# Patient Record
Sex: Male | Born: 1955 | Race: White | Hispanic: No | Marital: Married | Smoking: Never smoker
Health system: Southern US, Community
[De-identification: ages and names within clinical notes are randomized; demographics above are authoritative.]

## PROBLEM LIST (undated history)

## (undated) DIAGNOSIS — M7552 Bursitis of left shoulder: Secondary | ICD-10-CM

## (undated) DIAGNOSIS — E785 Hyperlipidemia, unspecified: Secondary | ICD-10-CM

## (undated) HISTORY — PX: SHOULDER SURGERY: SHX246

## (undated) HISTORY — DX: Hyperlipidemia, unspecified: E78.5

## (undated) HISTORY — DX: Bursitis of left shoulder: M75.52

---

## 2004-04-19 ENCOUNTER — Ambulatory Visit (HOSPITAL_COMMUNITY): Admission: RE | Admit: 2004-04-19 | Discharge: 2004-04-19 | Payer: Self-pay | Admitting: Internal Medicine

## 2004-04-19 ENCOUNTER — Encounter (INDEPENDENT_AMBULATORY_CARE_PROVIDER_SITE_OTHER): Payer: Self-pay | Admitting: Specialist

## 2008-03-06 ENCOUNTER — Ambulatory Visit: Payer: Self-pay | Admitting: Internal Medicine

## 2009-03-22 ENCOUNTER — Telehealth: Payer: Self-pay | Admitting: Internal Medicine

## 2009-04-09 ENCOUNTER — Encounter: Payer: Self-pay | Admitting: Internal Medicine

## 2009-04-10 ENCOUNTER — Telehealth (INDEPENDENT_AMBULATORY_CARE_PROVIDER_SITE_OTHER): Payer: Self-pay | Admitting: *Deleted

## 2009-04-10 DIAGNOSIS — G4726 Circadian rhythm sleep disorder, shift work type: Secondary | ICD-10-CM

## 2009-04-12 ENCOUNTER — Encounter (INDEPENDENT_AMBULATORY_CARE_PROVIDER_SITE_OTHER): Payer: Self-pay | Admitting: *Deleted

## 2009-09-13 ENCOUNTER — Telehealth: Payer: Self-pay | Admitting: Internal Medicine

## 2010-08-18 LAB — CONVERTED CEMR LAB
Albumin: 4.3 g/dL (ref 3.5–5.2)
Alkaline Phosphatase: 31 units/L — ABNORMAL LOW (ref 39–117)
BUN: 20 mg/dL (ref 6–23)
Bilirubin Urine: NEGATIVE
Calcium: 9.2 mg/dL (ref 8.4–10.5)
Cholesterol: 221 mg/dL (ref 0–200)
Direct LDL: 150.6 mg/dL
Eosinophils Absolute: 0.1 10*3/uL (ref 0.0–0.7)
Eosinophils Relative: 2.1 % (ref 0.0–5.0)
GFR calc Af Amer: 82 mL/min
GFR calc non Af Amer: 68 mL/min
Glucose, Urine, Semiquant: NEGATIVE
HCT: 39.2 % (ref 39.0–52.0)
Hemoglobin: 13.6 g/dL (ref 13.0–17.0)
Ketones, urine, test strip: NEGATIVE
MCV: 92 fL (ref 78.0–100.0)
Monocytes Absolute: 0.6 10*3/uL (ref 0.1–1.0)
Neutro Abs: 2.9 10*3/uL (ref 1.4–7.7)
PSA: 2.1 ng/mL (ref 0.10–4.00)
Platelets: 243 10*3/uL (ref 150–400)
Potassium: 4.2 meq/L (ref 3.5–5.1)
RDW: 12.3 % (ref 11.5–14.6)
Specific Gravity, Urine: 1.01
TSH: 2.27 microintl units/mL (ref 0.35–5.50)
Triglycerides: 72 mg/dL (ref 0–149)
WBC: 5.3 10*3/uL (ref 4.5–10.5)

## 2010-08-21 ENCOUNTER — Ambulatory Visit
Admission: RE | Admit: 2010-08-21 | Discharge: 2010-08-21 | Disposition: A | Discharge status: Home / Self Care | Payer: PRIVATE HEALTH INSURANCE | Source: Ambulatory Visit | Attending: Neurosurgery | Admitting: Neurosurgery

## 2010-08-21 ENCOUNTER — Other Ambulatory Visit: Payer: Self-pay | Admitting: Neurosurgery

## 2010-08-21 DIAGNOSIS — M545 Low back pain: Secondary | ICD-10-CM

## 2010-08-21 NOTE — Progress Notes (Signed)
Summary: Schedule Colonoscopy  Phone Note Outgoing Call   Call placed by: Hortense Ramal CMA Duncan Dull),  September 13, 2009 12:00 PM Call placed to: Patient Summary of Call: Patient is due for a colonoscopy. I have left a message with a male for patient to call back. Initial call taken by: Hortense Ramal CMA Duncan Dull),  September 13, 2009 12:02 PM  Follow-up for Phone Call        Spoke with Patient. He states he will call back and schedule colonoscopy once he looks back at his calender and finds a good date to come. Follow-up by: Hortense Ramal CMA Duncan Dull),  September 17, 2009 4:27 PM

## 2010-08-22 ENCOUNTER — Other Ambulatory Visit: Payer: Self-pay | Admitting: Neurosurgery

## 2010-08-22 ENCOUNTER — Ambulatory Visit
Admission: RE | Admit: 2010-08-22 | Discharge: 2010-08-22 | Disposition: A | Discharge status: Home / Self Care | Payer: PRIVATE HEALTH INSURANCE | Source: Ambulatory Visit | Attending: Neurosurgery | Admitting: Neurosurgery

## 2010-08-22 DIAGNOSIS — M549 Dorsalgia, unspecified: Secondary | ICD-10-CM

## 2011-12-16 ENCOUNTER — Telehealth: Payer: Self-pay | Admitting: Internal Medicine

## 2011-12-16 NOTE — Telephone Encounter (Signed)
Pt has not been seen since 2009.  We can't call in any meds until he is seen

## 2011-12-16 NOTE — Telephone Encounter (Signed)
Patient called stating that he will be working overnight next week and would like to know if you can call him in 4 provigil into his pharmacy [facility] [address]. Please advise.

## 2012-04-28 ENCOUNTER — Telehealth: Payer: Self-pay | Admitting: Internal Medicine

## 2012-04-28 NOTE — Telephone Encounter (Signed)
Pt would like to re-est with Dr Timoteo Gaul last appt 2009.

## 2012-04-29 NOTE — Telephone Encounter (Signed)
Yes. Schedule appt with dr. Pecolia Ades

## 2012-04-30 NOTE — Telephone Encounter (Signed)
Pt has been sch

## 2012-06-04 ENCOUNTER — Encounter: Payer: Self-pay | Admitting: Internal Medicine

## 2012-06-04 ENCOUNTER — Ambulatory Visit (INDEPENDENT_AMBULATORY_CARE_PROVIDER_SITE_OTHER): Payer: PRIVATE HEALTH INSURANCE | Admitting: Internal Medicine

## 2012-06-04 VITALS — BP 114/78 | HR 60 | Temp 97.5°F | Ht 73.0 in | Wt 172.0 lb

## 2012-06-04 DIAGNOSIS — Z0000 Encounter for general adult medical examination without abnormal findings: Secondary | ICD-10-CM

## 2012-06-06 NOTE — Progress Notes (Signed)
Patient ID: Collin Ford, male   DOB: Oct 23, 1955, 56 y.o.   MRN: [id number] cpx  History reviewed. No pertinent past medical history.  History   Social History  . Marital Status: Married    Spouse Name: N/A    Number of Children: N/A  . Years of Education: N/A   Occupational History  . Not on file.   Social History Main Topics  . Smoking status: Never Smoker   . Smokeless tobacco: Not on file  . Alcohol Use: 3.0 oz/week    5 Glasses of wine per week  . Drug Use: No  . Sexually Active: Not on file   Other Topics Concern  . Not on file   Social History Narrative  . No narrative on file    Past Surgical History  Procedure Date  . Shoulder surgery 1990 & 1997    x2, right    Family History  Problem Relation Age of Onset  . Diabetes    . Diabetes Father   . Parkinsonism Father     Allergies  Allergen Reactions  . Sulfamethoxazole     REACTION: hives    No current outpatient prescriptions on file prior to visit.     patient denies chest pain, shortness of breath, orthopnea. Denies lower extremity edema, abdominal pain, change in appetite, change in bowel movements. Patient denies rashes, musculoskeletal complaints. No other specific complaints in a complete review of systems.   BP 114/78  Pulse 60  Temp 97.5 F (36.4 C) (Oral)  Ht 6\' 1"  (1.854 m)  Wt 172 lb (78.019 kg)  BMI 22.69 kg/m2  well-developed well-nourished male in no acute distress. HEENT exam atraumatic, normocephalic, neck supple without jugular venous distention. Chest clear to auscultation cardiac exam S1-S2 are regular. Abdominal exam overweight with bowel sounds, soft and nontender. Extremities no edema. Neurologic exam is alert with a normal gait.  A/p- well visit- Health Maint UTD

## 2012-10-05 ENCOUNTER — Encounter: Payer: Self-pay | Admitting: Internal Medicine

## 2012-10-26 ENCOUNTER — Other Ambulatory Visit: Payer: Self-pay | Admitting: Orthopedic Surgery

## 2012-10-26 DIAGNOSIS — M25511 Pain in right shoulder: Secondary | ICD-10-CM

## 2012-10-27 ENCOUNTER — Ambulatory Visit
Admission: RE | Admit: 2012-10-27 | Discharge: 2012-10-27 | Disposition: A | Discharge status: Home / Self Care | Payer: PRIVATE HEALTH INSURANCE | Source: Ambulatory Visit | Attending: Orthopedic Surgery | Admitting: Orthopedic Surgery

## 2012-10-27 DIAGNOSIS — M25511 Pain in right shoulder: Secondary | ICD-10-CM

## 2012-10-27 MED ORDER — IOHEXOL 180 MG/ML  SOLN: 13.0000 mL | Freq: Once | INTRAMUSCULAR | Status: AC | PRN

## 2012-11-02 ENCOUNTER — Other Ambulatory Visit: Payer: PRIVATE HEALTH INSURANCE

## 2012-12-28 ENCOUNTER — Encounter: Payer: PRIVATE HEALTH INSURANCE | Admitting: Internal Medicine

## 2013-03-15 ENCOUNTER — Ambulatory Visit
Admission: RE | Admit: 2013-03-15 | Discharge: 2013-03-15 | Disposition: A | Discharge status: Home / Self Care | Payer: PRIVATE HEALTH INSURANCE | Source: Ambulatory Visit | Attending: Neurosurgery | Admitting: Neurosurgery

## 2013-03-15 ENCOUNTER — Other Ambulatory Visit: Payer: Self-pay | Admitting: Neurosurgery

## 2013-03-15 DIAGNOSIS — M545 Low back pain: Secondary | ICD-10-CM

## 2013-03-16 ENCOUNTER — Other Ambulatory Visit: Payer: Self-pay | Admitting: Neurosurgery

## 2013-03-16 DIAGNOSIS — M549 Dorsalgia, unspecified: Secondary | ICD-10-CM

## 2013-03-17 ENCOUNTER — Ambulatory Visit
Admission: RE | Admit: 2013-03-17 | Discharge: 2013-03-17 | Disposition: A | Discharge status: Home / Self Care | Payer: PRIVATE HEALTH INSURANCE | Source: Ambulatory Visit | Attending: Neurosurgery | Admitting: Neurosurgery

## 2013-03-17 ENCOUNTER — Other Ambulatory Visit: Payer: Self-pay | Admitting: Neurosurgery

## 2013-03-17 DIAGNOSIS — M549 Dorsalgia, unspecified: Secondary | ICD-10-CM

## 2013-03-17 MED ORDER — IOHEXOL 180 MG/ML  SOLN
1.0000 mL | Freq: Once | INTRAMUSCULAR | Status: AC | PRN
  Administered 2013-03-17: 1 mL via EPIDURAL

## 2013-03-17 MED ORDER — METHYLPREDNISOLONE ACETATE 40 MG/ML INJ SUSP (RADIOLOG: 120.0000 mg | Freq: Once | INTRAMUSCULAR | Status: DC

## 2013-04-28 ENCOUNTER — Encounter: Payer: PRIVATE HEALTH INSURANCE | Admitting: Internal Medicine

## 2013-06-22 ENCOUNTER — Encounter: Payer: Self-pay | Admitting: Internal Medicine

## 2018-03-10 ENCOUNTER — Other Ambulatory Visit: Payer: Self-pay | Admitting: Otolaryngology

## 2018-03-10 DIAGNOSIS — R05 Cough: Secondary | ICD-10-CM

## 2018-03-10 DIAGNOSIS — R053 Chronic cough: Secondary | ICD-10-CM

## 2018-03-11 ENCOUNTER — Ambulatory Visit
Admission: RE | Admit: 2018-03-11 | Discharge: 2018-03-11 | Disposition: A | Discharge status: Home / Self Care | Payer: PRIVATE HEALTH INSURANCE | Source: Ambulatory Visit | Attending: Otolaryngology | Admitting: Otolaryngology

## 2018-03-11 DIAGNOSIS — R05 Cough: Secondary | ICD-10-CM

## 2018-03-11 DIAGNOSIS — R053 Chronic cough: Secondary | ICD-10-CM

## 2018-03-15 ENCOUNTER — Other Ambulatory Visit: Payer: Self-pay | Admitting: Otolaryngology

## 2018-03-15 DIAGNOSIS — R059 Cough, unspecified: Secondary | ICD-10-CM

## 2018-03-15 DIAGNOSIS — R05 Cough: Secondary | ICD-10-CM

## 2018-03-16 ENCOUNTER — Ambulatory Visit
Admission: RE | Admit: 2018-03-16 | Discharge: 2018-03-16 | Disposition: A | Discharge status: Home / Self Care | Payer: PRIVATE HEALTH INSURANCE | Source: Ambulatory Visit | Attending: Otolaryngology | Admitting: Otolaryngology

## 2018-03-16 DIAGNOSIS — R059 Cough, unspecified: Secondary | ICD-10-CM

## 2018-03-16 DIAGNOSIS — R05 Cough: Secondary | ICD-10-CM

## 2018-04-16 ENCOUNTER — Other Ambulatory Visit: Payer: Self-pay | Admitting: Neurosurgery

## 2018-04-16 DIAGNOSIS — M47816 Spondylosis without myelopathy or radiculopathy, lumbar region: Secondary | ICD-10-CM

## 2018-04-22 ENCOUNTER — Ambulatory Visit
Admission: RE | Admit: 2018-04-22 | Discharge: 2018-04-22 | Disposition: A | Discharge status: Home / Self Care | Payer: PRIVATE HEALTH INSURANCE | Source: Ambulatory Visit | Attending: Neurosurgery | Admitting: Neurosurgery

## 2018-04-22 DIAGNOSIS — M47816 Spondylosis without myelopathy or radiculopathy, lumbar region: Secondary | ICD-10-CM

## 2018-06-08 ENCOUNTER — Ambulatory Visit
Admission: RE | Admit: 2018-06-08 | Discharge: 2018-06-08 | Disposition: A | Discharge status: Home / Self Care | Payer: PRIVATE HEALTH INSURANCE | Source: Ambulatory Visit | Attending: Orthopedic Surgery | Admitting: Orthopedic Surgery

## 2018-06-08 ENCOUNTER — Other Ambulatory Visit: Payer: Self-pay | Admitting: Orthopedic Surgery

## 2018-06-08 DIAGNOSIS — M25511 Pain in right shoulder: Secondary | ICD-10-CM

## 2020-05-28 DIAGNOSIS — L821 Other seborrheic keratosis: Secondary | ICD-10-CM | POA: Diagnosis not present

## 2020-05-28 DIAGNOSIS — L649 Androgenic alopecia, unspecified: Secondary | ICD-10-CM | POA: Diagnosis not present

## 2020-05-28 DIAGNOSIS — L570 Actinic keratosis: Secondary | ICD-10-CM | POA: Diagnosis not present

## 2020-08-02 DIAGNOSIS — D165 Benign neoplasm of lower jaw bone: Secondary | ICD-10-CM | POA: Diagnosis not present

## 2020-10-13 ENCOUNTER — Encounter (HOSPITAL_BASED_OUTPATIENT_CLINIC_OR_DEPARTMENT_OTHER): Payer: Self-pay

## 2020-10-13 ENCOUNTER — Emergency Department (HOSPITAL_BASED_OUTPATIENT_CLINIC_OR_DEPARTMENT_OTHER)
Admission: EM | Admit: 2020-10-13 | Discharge: 2020-10-13 | Disposition: A | Discharge status: Home / Self Care | Payer: BC Managed Care – PPO | Attending: Emergency Medicine | Admitting: Emergency Medicine

## 2020-10-13 ENCOUNTER — Other Ambulatory Visit: Payer: Self-pay

## 2020-10-13 DIAGNOSIS — W2209XA Striking against other stationary object, initial encounter: Secondary | ICD-10-CM | POA: Insufficient documentation

## 2020-10-13 DIAGNOSIS — S0591XA Unspecified injury of right eye and orbit, initial encounter: Secondary | ICD-10-CM | POA: Diagnosis not present

## 2020-10-13 DIAGNOSIS — Y92009 Unspecified place in unspecified non-institutional (private) residence as the place of occurrence of the external cause: Secondary | ICD-10-CM | POA: Insufficient documentation

## 2020-10-13 DIAGNOSIS — S0501XA Injury of conjunctiva and corneal abrasion without foreign body, right eye, initial encounter: Secondary | ICD-10-CM | POA: Insufficient documentation

## 2020-10-13 DIAGNOSIS — Y93E5 Activity, floor mopping and cleaning: Secondary | ICD-10-CM | POA: Insufficient documentation

## 2020-10-13 MED ORDER — FLUORESCEIN SODIUM 1 MG OP STRP
1.0000 | ORAL_STRIP | Freq: Once | OPHTHALMIC | Status: AC
  Administered 2020-10-13: 1 via OPHTHALMIC
  Filled 2020-10-13: qty 1

## 2020-10-13 MED ORDER — ERYTHROMYCIN 5 MG/GM OP OINT
TOPICAL_OINTMENT | Freq: Once | OPHTHALMIC | Status: AC
  Filled 2020-10-13: qty 3.5

## 2020-10-13 MED ORDER — TETRACAINE HCL 0.5 % OP SOLN
2.0000 [drp] | Freq: Once | OPHTHALMIC | Status: AC
  Administered 2020-10-13: 2 [drp] via OPHTHALMIC
  Filled 2020-10-13: qty 4

## 2020-10-13 MED ORDER — ERYTHROMYCIN 5 MG/GM OP OINT: TOPICAL_OINTMENT | OPHTHALMIC | 0 refills | Status: AC

## 2020-10-13 NOTE — ED Notes (Signed)
ED Provider at bedside. 

## 2020-10-13 NOTE — Discharge Instructions (Signed)
Apply the erythromycin ointment 4 times per day for 5 days to your right eye as shown (by smearing a small amount along the lower aspect of your lower eyelid, with clean hands).

## 2020-10-13 NOTE — ED Triage Notes (Signed)
Pt came in c/o right eye injury -  States he poke his right eye with  Little card board  30  mins ago  - denies visual  Changes

## 2020-10-13 NOTE — ED Provider Notes (Signed)
[facility] EMERGENCY DEPT Provider Note   CSN: [id number] Arrival date & time: 10/13/20  2019     History Chief Complaint  Patient presents with  . Eye Injury    Collin Ford is a 65 y.o. male presenting to ED with right eye pain.  He accidentally scraped his right eye against the edge of a cardboard box while cleaning at his house tonight.  Initially had pain and blurred vision, but vision is now intact and pain has become minimal.  Does not wear contacts or prescription lenses.  Patient is a radiologist.  HPI     History reviewed. No pertinent past medical history.  Patient Active Problem List   Diagnosis Date Noted  . 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 SHOULDER SURGERY  1990 & 1997   x2, right       Family History  Problem Relation Age of Onset  . Diabetes Other   . Diabetes Father   . Parkinsonism Father     Social History   Tobacco Use  . Smoking status: Never Smoker  Vaping Use  . Vaping Use: Never used  Substance Use Topics  . Alcohol use: Yes    Alcohol/week: 5.0 standard drinks    Types: 5 Glasses of wine per week  . Drug use: No    Home Medications Prior to Admission medications   Medication Sig Start Date End Date Taking? Authorizing Provider  erythromycin ophthalmic ointment Place a 1/2 inch ribbon of ointment into the lower eyelid of right eye, four times daily, for 5 days 10/13/20 10/17/20 Yes Keller Mikels, Kermit Balo, MD    Allergies    Sulfamethoxazole  Review of Systems   Review of Systems  Constitutional: Negative for chills and fever.  HENT: Positive for ear pain. Negative for sore throat.   Eyes: Positive for photophobia, pain, redness and visual disturbance. Negative for itching.  All other systems reviewed and are negative.   Physical Exam Updated Vital Signs BP 125/73 (BP Location: Right Arm)   Pulse 72   Temp 98.1 F (36.7 C)  (Oral)   Resp 18   Ht 6' (1.829 m)   Wt 74.8 kg   SpO2 100%   BMI 22.38 kg/m   Physical Exam Constitutional:      General: He is not in acute distress. HENT:     Head: Normocephalic and atraumatic.  Eyes:     General: Lids are normal. Lids are everted, no foreign bodies appreciated. Vision grossly intact. Gaze aligned appropriately.     Extraocular Movements:     Right eye: Normal extraocular motion.     Left eye: Normal extraocular motion.     Conjunctiva/sclera: Conjunctivae normal.     Right eye: Right conjunctiva is not injected. No chemosis, exudate or hemorrhage.    Left eye: Left conjunctiva is not injected. No chemosis, exudate or hemorrhage.    Pupils: Pupils are equal, round, and reactive to light.     Comments: Fluorescein stain with small punctate lesion and uptake on central lower cornea  Cardiovascular:     Rate and Rhythm: Normal rate and regular rhythm.  Skin:    General: Skin is warm and dry.  Neurological:     General: No focal deficit present.     Mental Status: He is alert. Mental status is at baseline.  Psychiatric:        Mood and Affect: Mood normal.  Behavior: Behavior normal.     ED Results / Procedures / Treatments   Labs (all labs ordered are listed, but only abnormal results are displayed) Labs Reviewed - No data to display  EKG None  Radiology No results found.  Procedures Procedures   Medications Ordered in ED Medications  fluorescein ophthalmic strip 1 strip (1 strip Both Eyes Given 10/13/20 2037)  tetracaine (PONTOCAINE) 0.5 % ophthalmic solution 2 drop (2 drops Both Eyes Given 10/13/20 2036)  erythromycin ophthalmic ointment ( Right Eye Given 10/13/20 2105)    ED Course  I have reviewed the triage vital signs and the nursing notes.  Pertinent labs & imaging results that were available during my care of the patient were reviewed by me and considered in my medical decision making (see chart for details).  Very small  corneal abrasion to right eye on staining No evidence of globe rupture Vision is grossly intact  Will start on erythromycin ointment x 5 days    Final Clinical Impression(s) / ED Diagnoses Final diagnoses:  Abrasion of right cornea, initial encounter    Rx / DC Orders ED Discharge Orders         Ordered    erythromycin ophthalmic ointment        10/13/20 2100           Terald Sleeper, MD 10/13/20 2118

## 2020-10-18 ENCOUNTER — Other Ambulatory Visit (HOSPITAL_COMMUNITY): Payer: Self-pay | Admitting: Emergency Medicine

## 2020-10-19 ENCOUNTER — Other Ambulatory Visit (HOSPITAL_COMMUNITY): Payer: Self-pay | Admitting: *Deleted

## 2020-10-31 ENCOUNTER — Ambulatory Visit (HOSPITAL_BASED_OUTPATIENT_CLINIC_OR_DEPARTMENT_OTHER)
Admission: RE | Admit: 2020-10-31 | Discharge: 2020-10-31 | Disposition: A | Discharge status: Home / Self Care | Payer: BC Managed Care – PPO | Source: Ambulatory Visit | Attending: Cardiology | Admitting: Cardiology

## 2020-10-31 ENCOUNTER — Other Ambulatory Visit: Payer: Self-pay

## 2020-12-20 ENCOUNTER — Ambulatory Visit: Payer: BC Managed Care – PPO | Attending: Internal Medicine

## 2020-12-20 ENCOUNTER — Other Ambulatory Visit: Payer: Self-pay

## 2020-12-20 ENCOUNTER — Other Ambulatory Visit (HOSPITAL_BASED_OUTPATIENT_CLINIC_OR_DEPARTMENT_OTHER): Payer: Self-pay

## 2020-12-20 DIAGNOSIS — Z23 Encounter for immunization: Secondary | ICD-10-CM

## 2020-12-20 MED ORDER — PFIZER-BIONT COVID-19 VAC-TRIS 30 MCG/0.3ML IM SUSP
INTRAMUSCULAR | 0 refills | Status: DC
  Filled 2020-12-20: qty 0.3, 1d supply, fill #0

## 2020-12-20 NOTE — Progress Notes (Signed)
   Covid-19 Vaccination Clinic  Name:  Raihan Kimmel    MRN: [id number] DOB: 1956-04-24  12/20/2020  Mr. Rittenhouse was observed post Covid-19 immunization for 15 minutes without incident. He was provided with Vaccine Information Sheet and instruction to access the V-Safe system.   Mr. Ku was instructed to call 911 with any severe reactions post vaccine: Marland Kitchen Difficulty breathing  . Swelling of face and throat  . A fast heartbeat  . A bad rash all over body  . Dizziness and weakness   Immunizations Administered    Name Date Dose VIS Date Route   PFIZER Comrnaty(Gray TOP) Covid-19 Vaccine 12/20/2020 11:37 AM 0.3 mL 06/28/2020 Intramuscular   Manufacturer: [employer], [employer]   Lot: [id number]   NDC: [phone]

## 2021-02-18 DIAGNOSIS — Z0000 Encounter for general adult medical examination without abnormal findings: Secondary | ICD-10-CM | POA: Diagnosis not present

## 2021-02-18 DIAGNOSIS — Z1339 Encounter for screening examination for other mental health and behavioral disorders: Secondary | ICD-10-CM | POA: Diagnosis not present

## 2021-02-18 DIAGNOSIS — Z1331 Encounter for screening for depression: Secondary | ICD-10-CM | POA: Diagnosis not present

## 2021-02-18 DIAGNOSIS — M25512 Pain in left shoulder: Secondary | ICD-10-CM | POA: Diagnosis not present

## 2021-02-18 DIAGNOSIS — R7301 Impaired fasting glucose: Secondary | ICD-10-CM | POA: Diagnosis not present

## 2021-05-13 ENCOUNTER — Other Ambulatory Visit (HOSPITAL_BASED_OUTPATIENT_CLINIC_OR_DEPARTMENT_OTHER): Payer: Self-pay

## 2021-05-13 ENCOUNTER — Other Ambulatory Visit: Payer: Self-pay

## 2021-05-13 ENCOUNTER — Ambulatory Visit: Payer: BC Managed Care – PPO | Attending: Internal Medicine

## 2021-05-13 DIAGNOSIS — Z23 Encounter for immunization: Secondary | ICD-10-CM

## 2021-05-13 MED ORDER — PFIZER COVID-19 VAC BIVALENT 30 MCG/0.3ML IM SUSP
INTRAMUSCULAR | 0 refills | Status: DC
  Filled 2021-05-13: qty 0.3, 1d supply, fill #0

## 2021-05-13 MED ORDER — FLUARIX QUADRIVALENT 0.5 ML IM SUSY
PREFILLED_SYRINGE | INTRAMUSCULAR | 0 refills | Status: DC
  Filled 2021-05-13: qty 0.5, 1d supply, fill #0

## 2021-05-13 NOTE — Progress Notes (Signed)
   Covid-19 Vaccination Clinic  Name:  Collin Ford    MRN: [id number] DOB: 12/30/1955  05/13/2021  [name] was observed post Covid-19 immunization for 15 minutes without incident. He was provided with Vaccine Information Sheet and instruction to access the V-Safe system.   [name] was instructed to call 911 with any severe reactions post vaccine: Difficulty breathing  Swelling of face and throat  A fast heartbeat  A bad rash all over body  Dizziness and weakness   Immunizations Administered     Name Date Dose VIS Date Route   Pfizer Covid-19 Vaccine Bivalent Booster 05/13/2021  2:06 PM 0.3 mL 03/20/2021 Intramuscular   Manufacturer: [employer], [employer]   Lot: [id number]   NDC: [phone]

## 2021-09-04 IMAGING — CT CT CARDIAC CORONARY ARTERY CALCIUM SCORE
3 series · 14 of 20 positions shown, 16 images · non-contrast
Comparison: None.
COMPARISON: None.

Addendum:
EXAM:
OVER-READ INTERPRETATION  CT CHEST

The following report is an over-read performed by radiologist Dr.
Yomara Payamps [REDACTED] on 10/31/2020. This over-read
does not include interpretation of cardiac or coronary anatomy or
pathology. The coronary calcium interpretation by the cardiologist
is attached.
CLINICAL DATA: Cardiovascular Disease Risk stratification
Coronary Calcium Score
TECHNIQUE: A gated, non-contrast computed tomography scan of the heart was
performed using 3mm slice thickness. Axial images were analyzed on a
dedicated workstation. Calcium scoring of the coronary arteries was
performed using the Agatston method.

[Series 2: casc 3.0 best diast 70 % (id) · axial · 0.39mm/px · z∈[+1233,+1332]mm · 4 of 55 slices shown]
[im 11/55  vessel]
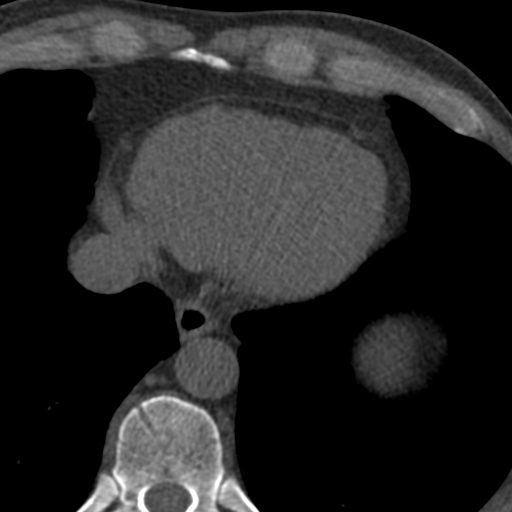
[im 22/55  vessel]
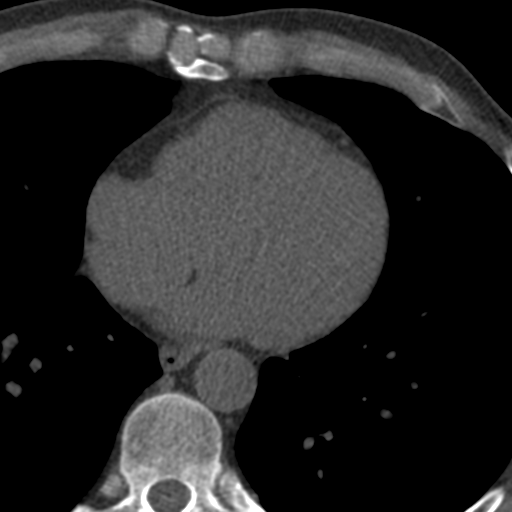
[im 33/55  vessel]
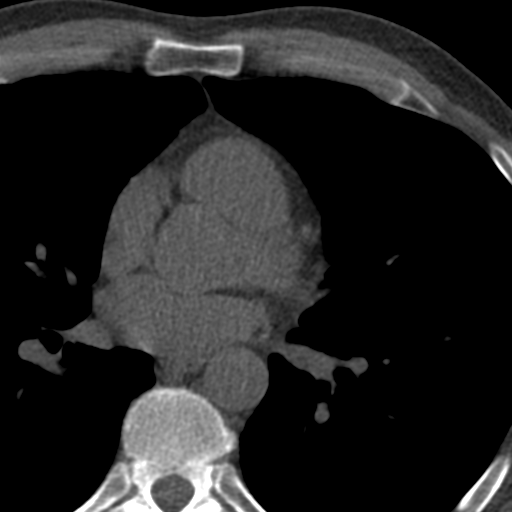
[im 44/55  vessel]
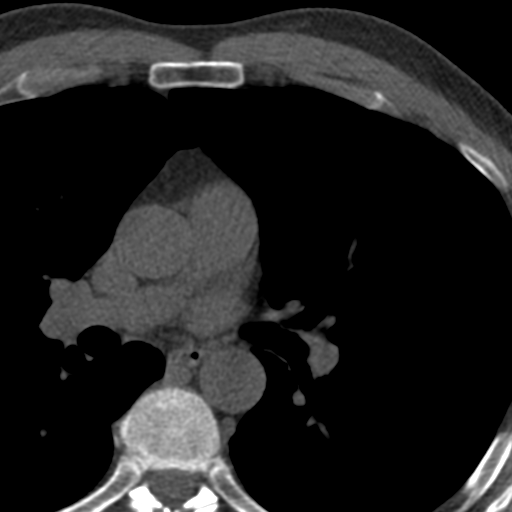

[Series 3: soft full fov 69 % · axial · 0.64mm/px · z∈[+1230,+1338]mm · 5 of 55 slices shown, 7 images]
[im 10/55  vessel]
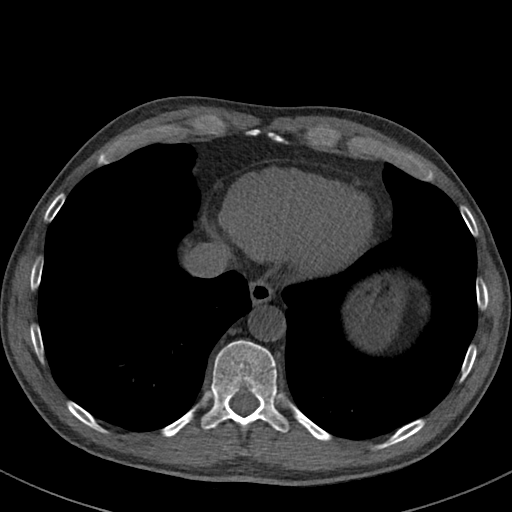
[im 10/55  lung]
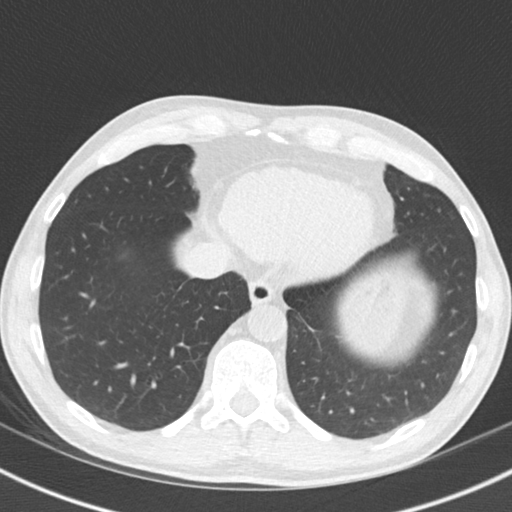
[im 19/55  vessel]
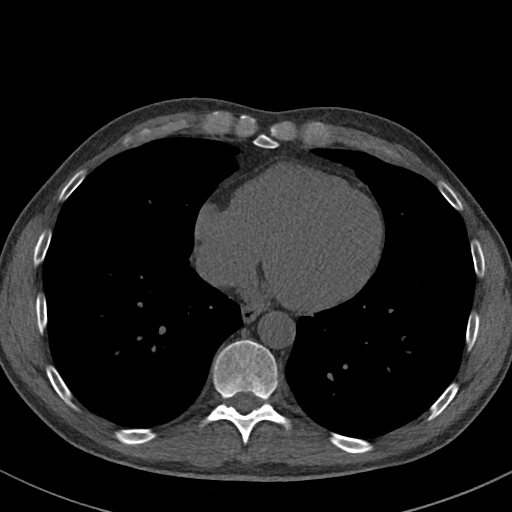
[im 28/55  vessel]
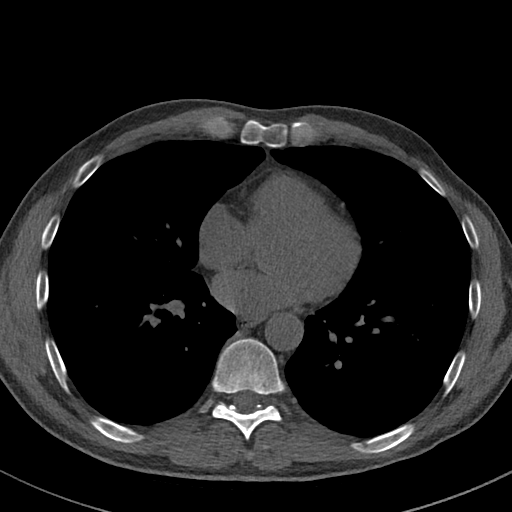
[im 37/55  vessel]
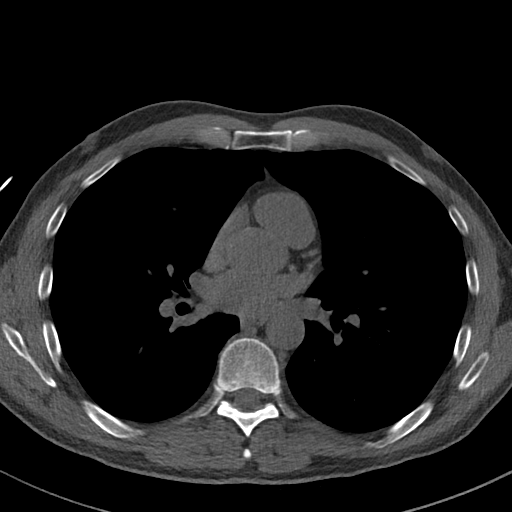
[im 46/55  vessel]
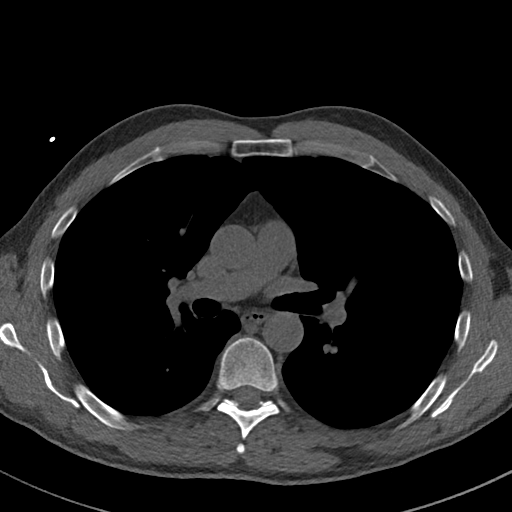
[im 46/55  lung]
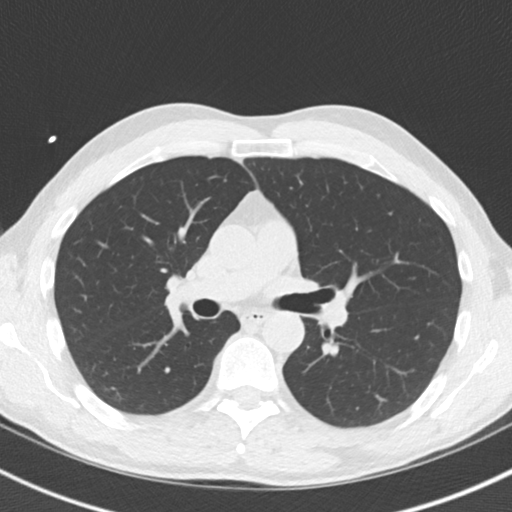

[Series 4: lungs 69 % · axial · 0.64mm/px · z∈[+1230,+1338]mm · 5 of 55 slices shown]
[im 10/55  vessel]
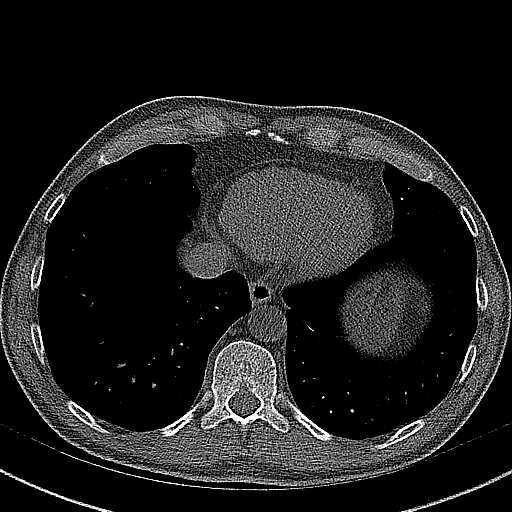
[im 19/55  vessel]
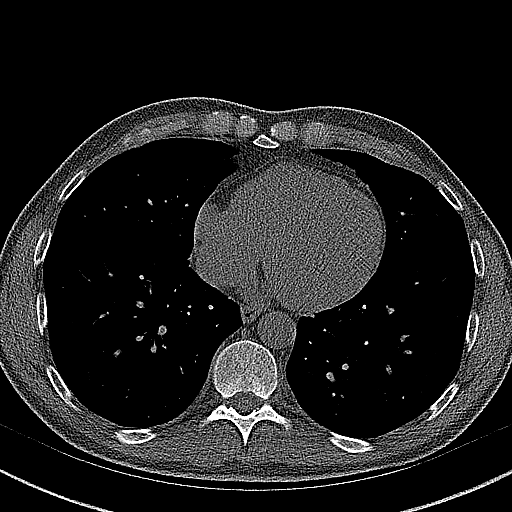
[im 28/55  vessel]
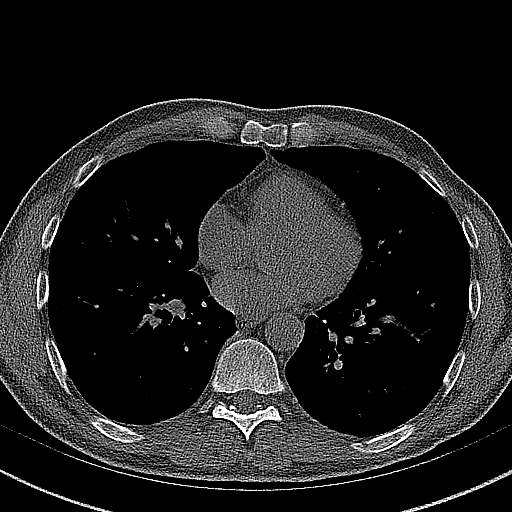
[im 37/55  vessel]
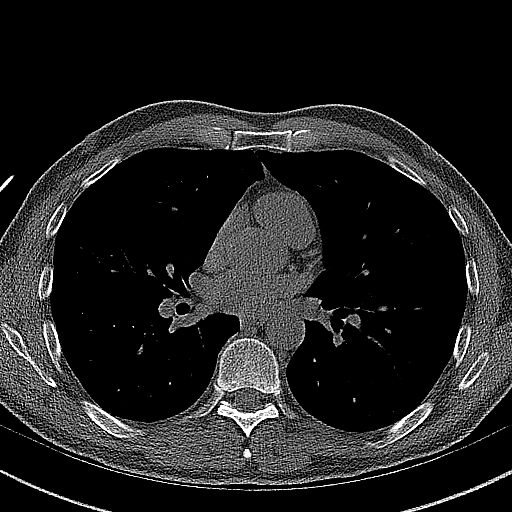
[im 46/55  vessel]
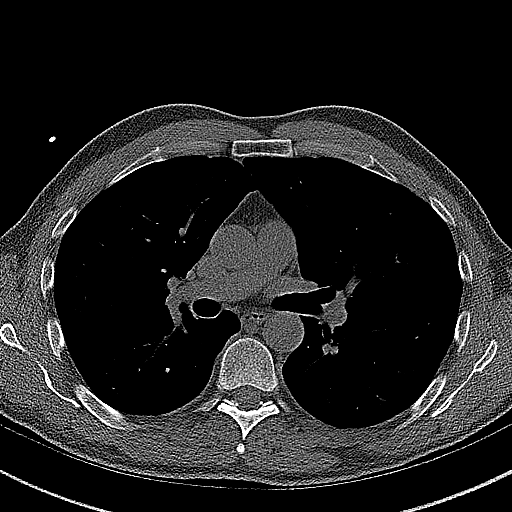

[14 of 20 positions shown; findings below may reference images not displayed]

FINDINGS: Vascular: Normal caliber of the visualized thoracic aorta. Heart
size is normal.

Mediastinum/Nodes: Visualized mediastinal structures are normal
limits.

Lungs/Pleura: 2 mm nodule in the periphery of the right lower lobe
on sequence 4, image 28. No large pleural effusions. Additional
punctate nodule in the right middle lobe on sequence 4, image 27. 3
mm nodule in the periphery of the left lower lobe on image 42. There
are additional tiny nodules.

Upper Abdomen: Visualized upper abdominal structures are
unremarkable.

Musculoskeletal: No acute bone abnormality.
IMPRESSION: 1. No acute abnormality involving the extracardiac structures.
2. Tiny pulmonary nodules, largest measuring 3 mm. These are likely
incidental findings but indeterminate. No follow-up needed if
patient is low-risk (and has no known or suspected primary
neoplasm). Non-contrast chest CT can be considered in 12 months if
patient is high-risk. This recommendation follows the consensus
statement: Guidelines for Management of Incidental Pulmonary Nodules
Detected on CT Images: From the [HOSPITAL] 9117; Radiology
FINDINGS: Coronary arteries: Normal origins.

Coronary Calcium Score:

Left main: 0

Left anterior descending artery: 0

Left circumflex artery: 0

Right coronary artery: 0

Total: 0

Percentile: 0

Pericardium: Normal.

Aorta: Normal caliber of ascending aorta. No aortic atherosclerosis
noted.

Non-cardiac: See separate report from [REDACTED].
IMPRESSION: Coronary calcium score of 0. This was 0 percentile for age-, race-,
and sex-matched controls.



If CAC=0, it is reasonable to withhold statin therapy and reassess
in 5 to 10 years, as long as higher risk conditions are absent
(diabetes mellitus, family history of premature CHD in first degree
relatives (males <55 years; females <65 years), cigarette smoking,
or LDL >=190 mg/dL).

If CAC is 1 to 99, it is reasonable to initiate statin therapy for
patients >=55 years of age.

If CAC is >=100 or >=75th percentile, it is reasonable to initiate
statin therapy at any age.

Cardiology referral should be considered for patients with CAC
scores >=400 or >=75th percentile.

*1022 AHA/ACC/AACVPR/AAPA/ABC/DXN/TAFOLLA/COC/[place]/KADIJAH/JAMPIER/NEYERLII
Guideline on the Management of Blood Cholesterol: A Report of the
American College of Cardiology/American Heart Association Task Force
on Clinical Practice Guidelines. J Am Coll Cardiol.
1166;73(24):0253-0223.

*** End of Addendum ***
EXAM:
OVER-READ INTERPRETATION  CT CHEST

The following report is an over-read performed by radiologist Dr.
Yomara Payamps [REDACTED] on 10/31/2020. This over-read
does not include interpretation of cardiac or coronary anatomy or
pathology. The coronary calcium interpretation by the cardiologist
is attached.
FINDINGS: Vascular: Normal caliber of the visualized thoracic aorta. Heart
size is normal.

Mediastinum/Nodes: Visualized mediastinal structures are normal
limits.

Lungs/Pleura: 2 mm nodule in the periphery of the right lower lobe
on sequence 4, image 28. No large pleural effusions. Additional
punctate nodule in the right middle lobe on sequence 4, image 27. 3
mm nodule in the periphery of the left lower lobe on image 42. There
are additional tiny nodules.

Upper Abdomen: Visualized upper abdominal structures are
unremarkable.

Musculoskeletal: No acute bone abnormality.
IMPRESSION: 1. No acute abnormality involving the extracardiac structures.
2. Tiny pulmonary nodules, largest measuring 3 mm. These are likely
incidental findings but indeterminate. No follow-up needed if
patient is low-risk (and has no known or suspected primary
neoplasm). Non-contrast chest CT can be considered in 12 months if
patient is high-risk. This recommendation follows the consensus
statement: Guidelines for Management of Incidental Pulmonary Nodules
Detected on CT Images: From the [HOSPITAL] 9117; Radiology

## 2021-09-12 DIAGNOSIS — M25562 Pain in left knee: Secondary | ICD-10-CM | POA: Diagnosis not present

## 2021-10-23 DIAGNOSIS — R51 Headache with orthostatic component, not elsewhere classified: Secondary | ICD-10-CM | POA: Diagnosis not present

## 2021-10-24 ENCOUNTER — Other Ambulatory Visit: Payer: Self-pay | Admitting: Internal Medicine

## 2021-10-24 DIAGNOSIS — R51 Headache with orthostatic component, not elsewhere classified: Secondary | ICD-10-CM

## 2021-10-25 ENCOUNTER — Ambulatory Visit
Admission: RE | Admit: 2021-10-25 | Discharge: 2021-10-25 | Disposition: A | Discharge status: Home / Self Care | Payer: BC Managed Care – PPO | Source: Ambulatory Visit | Attending: Internal Medicine | Admitting: Internal Medicine

## 2021-10-25 DIAGNOSIS — R51 Headache with orthostatic component, not elsewhere classified: Secondary | ICD-10-CM

## 2021-10-25 DIAGNOSIS — R519 Headache, unspecified: Secondary | ICD-10-CM | POA: Diagnosis not present

## 2021-10-25 MED ORDER — IOPAMIDOL (ISOVUE-300) INJECTION 61%
75.0000 mL | Freq: Once | INTRAVENOUS | Status: AC | PRN
  Administered 2021-10-25: 75 mL via INTRAVENOUS

## 2021-12-23 ENCOUNTER — Telehealth: Payer: Self-pay | Admitting: Neurology

## 2021-12-23 ENCOUNTER — Encounter: Payer: Self-pay | Admitting: Neurology

## 2021-12-23 ENCOUNTER — Ambulatory Visit (INDEPENDENT_AMBULATORY_CARE_PROVIDER_SITE_OTHER): Payer: BC Managed Care – PPO | Admitting: Neurology

## 2021-12-23 VITALS — BP 113/67 | HR 70 | Ht 72.0 in | Wt 174.2 lb

## 2021-12-23 DIAGNOSIS — G500 Trigeminal neuralgia: Secondary | ICD-10-CM | POA: Diagnosis not present

## 2021-12-23 DIAGNOSIS — G4484 Primary exertional headache: Secondary | ICD-10-CM | POA: Diagnosis not present

## 2021-12-23 DIAGNOSIS — R51 Headache with orthostatic component, not elsewhere classified: Secondary | ICD-10-CM | POA: Diagnosis not present

## 2021-12-23 DIAGNOSIS — R519 Headache, unspecified: Secondary | ICD-10-CM | POA: Diagnosis not present

## 2021-12-23 DIAGNOSIS — G4483 Primary cough headache: Secondary | ICD-10-CM

## 2021-12-23 DIAGNOSIS — H5711 Ocular pain, right eye: Secondary | ICD-10-CM

## 2021-12-23 MED ORDER — ALPRAZOLAM 0.25 MG PO TABS: ORAL_TABLET | ORAL | 0 refills | Status: DC

## 2021-12-23 NOTE — Progress Notes (Signed)
[facility]    Provider:  Dr Lucia Gaskins Requesting Provider: Cleatis Polka., MD Primary Care Provider:  Cleatis Polka., MD  CC:  headaches, right temporal   HPI:  Collin Ford is a 66 y.o. male here as requested by Cleatis Polka., MD for peripheral headache.  I reviewed Dr. Alver Fisher notes: Patient had right temporal headaches for 3 to 4 months, aching, stable pattern, no sinus congestion or drainage, headaches are postural, no pain when laying down at night, occurs most of the time when he leans his head over, present when bending over, better when he sits up, hurts when he coughs, resolves quickly, no other neurologic symptoms, specifically denies vision changes, weakness, numbness, tingling, balance issues, no jaw claudication symptoms or bruxism, no allergy symptoms or drainage.  Dr. Alver Fisher examination was normal including general, head, eyes, ears, oral cavity, throat, neck, lungs, heart, musculoskeletal, extremities and neurologic.  Dr. Clelia Croft did check a sed rate but I don't see that result.   Rarely ever had headaches, started in December 2022, he was bending over and he noticed it, he was coughing a lot when he had a cough and it hurt a lot and was worse, sed rate was normal, no inciting events, he took propecia for hair loss for years and stopped it in the fall but tha is the only change he made, happens daily, anytime he bears down, coughs, bends over, exerts himself, valsalva he can feel a problem in the right temple, when he bends over, brief, if he bears down, exertional and positional, first noticed it bending over, now he is really careful when bending over, better laying down, more pressure and just pain can't describe it, no FHx of headaches, his oldest daughter has migraines, he has not been to an eye doctor yet, no vision changes, no neck pain, no fatigue. No injury that he remembers. Not pulsating, pounding, no vision changes, no jaw pain or neck pain, no  light/sound sensitivity or nausea. No hx of rash. Can happen multiple times a day. He is learning to not bend over during the day, affecting him daily even when he bears down he can feel it. No other focal neurologic deficits, associated symptoms, inciting events or modifiable factors.  Reviewed notes, labs and imaging from outside physicians, which showed:  CT head 10/25/2021:  showed No acute intracranial abnormalities including mass lesion or mass effect, hydrocephalus, extra-axial fluid collection, midline shift, hemorrhage, or acute infarction, large ischemic events (personally reviewed images)  Review of Systems: Patient complains of symptoms per HPI as well as the following symptoms headache. Pertinent negatives and positives per HPI. All others negative.   Social History   Socioeconomic History   Marital status: Married    Spouse name: Not on file   Number of children: Not on file   Years of education: Not on file   Highest education level: Not on file  Occupational History   Not on file  Tobacco Use   Smoking status: Never   Smokeless tobacco: Not on file  Vaping Use   Vaping Use: Never used  Substance and Sexual Activity   Alcohol use: Yes    Alcohol/week: 5.0 standard drinks    Types: 5 Glasses of wine per week   Drug use: No   Sexual activity: Not on file  Other Topics Concern   Not on file  Social History Narrative   Not on file   Social Determinants of Health  Financial Resource Strain: Not on file  Food Insecurity: Not on file  Transportation Needs: Not on file  Physical Activity: Not on file  Stress: Not on file  Social Connections: Not on file  Intimate Partner Violence: Not on file    Family History  Problem Relation Age of Onset   Diabetes Father    Parkinsonism Father    Diabetes Other    Migraines Neg Hx    Headache Neg Hx     Past Medical History:  Diagnosis Date   Bursitis of left shoulder    HLD (hyperlipidemia)     Patient Active  Problem List   Diagnosis Date Noted   Right temporal headache 12/23/2021   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 May repeat if needed.Do not drive. 4 tablet 0   Ibuprofen (ADVIL PO) Take by mouth as needed.     No current facility-administered medications for this visit.    Allergies as of 12/23/2021 - Review Complete 12/23/2021  Allergen Reaction Noted   Sulfamethoxazole      Vitals: BP 113/67   Pulse 70   Ht 6' (1.829 m)   Wt 174 lb 3.2 oz (79 kg)   BMI 23.63 kg/m  Last Weight:  Wt Readings from Last 1 Encounters:  12/23/21 174 lb 3.2 oz (79 kg)   Last Height:   Ht Readings from Last 1 Encounters:  12/23/21 6' (1.829 m)     Physical exam: Exam: Gen: NAD, conversant, well nourised,  well groomed                     CV: RRR, no MRG. No Carotid Bruits. No peripheral edema, warm, nontender Eyes: Conjunctivae clear without exudates or hemorrhage  Neuro: Detailed Neurologic Exam  Speech:    Speech is normal; fluent and spontaneous with normal comprehension.  Cognition:    The patient is oriented to person, place, and time;     recent and remote memory intact;     language fluent;     normal attention, concentration,     fund of knowledge Cranial Nerves:    The pupils are equal, round, and reactive to light. The fundi are normal and spontaneous venous pulsations are present. Visual fields are full to finger confrontation. Extraocular movements are intact. Trigeminal sensation is intact and the muscles of mastication are normal. The face is symmetric. The palate elevates in the midline. Hearing intact. Voice is normal. Shoulder shrug is normal. The tongue has normal motion without  fasciculations.   Coordination:    Normal   Gait:     normal.   Motor Observation:    No asymmetry, no atrophy, and no involuntary movements noted. Tone:    Normal muscle tone.    Posture:    Posture is normal. normal erect    Strength:    Strength is V/V in the upper and lower limbs.      Sensation: intact to LT     Reflex Exam:  DTR's:    Deep tendon reflexes in the upper and lower extremities are normal bilaterally.   Toes:    The toes are downgoing bilaterally.   Clonus:    Clonus is absent.  Assessment/Plan:  66 year old male with new onset positional and exertional right-sided temporal headaches that do not fit a primary headache disorder and atypical in quality. Unclear diagnosis with a wide differential as below:  -3T MRI/MRA brain/head and also add trigeminal protocol to see if there is vascular loop compressing the trigeminal nerve or any other trigeminal nerve etiology; will give Xanax: MRI brain due to concerning symptoms of morning headaches, positional and exertional headaches, worsening headaches, daily headaches  to look for space occupying mass, chiari or intracranial hypertension (pseudotumor), strokes, malignancies, vasculidities, demyelination(multiple sclerosis), aneurysm or other. Will also order MRI with trigeminal protocol to see if there is a vascular loop on the trigeminal nerve causing v1 pain with valsalva/bending over and coughing.   -Temporal arteritis less likely but will order an Esr/crp. Could consider a temporal artery biopsy or even treat with a course of steroids -Positional quality could indicate low pressure headache or Spontaneous csf leak, Could try blood patch.  -Benign exertional/cough headache is a diagnosis of exclusion -Eye exam, I will contact Dr. Katy Fitch today, will make sure no right eye pathology causing this unusual headache  3T MRI due to positional and   Orders Placed This Encounter  Procedures   MR BRAIN W WO CONTRAST    MR ANGIO HEAD WO CONTRAST   MR FACE/TRIGEMINAL WO/W CM   Sedimentation rate   C-reactive protein   Basic Metabolic Panel   Ambulatory referral to Ophthalmology   Meds ordered this encounter  Medications   ALPRAZolam (XANAX) 0.25 MG tablet    Sig: Take 1-2 tabs (0.$RemoveBef'25mg'yRWsritOsI$ -0.$Remov'50mg'bozqUa$ ) 30-60 minutes before procedure. May repeat if needed.Do not drive.    Dispense:  4 tablet    Refill:  0    Cc: Ginger Organ., MD,  Ginger Organ., MD  Sarina Ill, MD  [facility] [address] [place], [place] [address]  Phone [phone] Fax [phone]

## 2021-12-23 NOTE — Patient Instructions (Addendum)
-  3T MRI/MRA brain/head and also add trigeminal protocol to see if there is vascular loop compressing the trigeminal nerve or any other trigeminal nerve etiology; will give Xanax -Temporal arteritis less likely but will order an Esr/crp. Consider a temporal artery biopsy or even treat with a course of steroids -Positional quality could indicate low pressure headache or Spontaneous csf leak, Could try blood patch.  -Benign exertional/cough headache is a diagnosis of exclusion -Eye exam, I will contact Dr. Katy Fitch today

## 2021-12-23 NOTE — Telephone Encounter (Signed)
Referral for Ophthalmology sent to [facility] [phone]. 

## 2021-12-24 LAB — BASIC METABOLIC PANEL
BUN/Creatinine Ratio: 16 (ref 10–24)
BUN: 20 mg/dL (ref 8–27)
CO2: 23 mmol/L (ref 20–29)
Calcium: 9.6 mg/dL (ref 8.6–10.2)
Chloride: 107 mmol/L — ABNORMAL HIGH (ref 96–106)
Creatinine, Ser: 1.27 mg/dL (ref 0.76–1.27)
Glucose: 95 mg/dL (ref 70–99)
Potassium: 4.6 mmol/L (ref 3.5–5.2)
Sodium: 142 mmol/L (ref 134–144)
eGFR: 63 mL/min/{1.73_m2} (ref >59)

## 2021-12-24 LAB — SEDIMENTATION RATE: Sed Rate: 2 mm/hr (ref 0–30)

## 2021-12-24 LAB — C-REACTIVE PROTEIN: CRP: <1 mg/L (ref 0–10)

## 2022-01-01 ENCOUNTER — Telehealth: Payer: Self-pay | Admitting: Neurology

## 2022-01-01 NOTE — Telephone Encounter (Signed)
[employer] NPR via website sent to GI 

## 2022-01-17 ENCOUNTER — Ambulatory Visit
Admission: RE | Admit: 2022-01-17 | Discharge: 2022-01-17 | Disposition: A | Discharge status: Home / Self Care | Payer: BC Managed Care – PPO | Source: Ambulatory Visit | Attending: Neurology | Admitting: Neurology

## 2022-01-17 ENCOUNTER — Other Ambulatory Visit: Payer: Self-pay | Admitting: Neurology

## 2022-01-17 DIAGNOSIS — G4483 Primary cough headache: Secondary | ICD-10-CM

## 2022-01-17 DIAGNOSIS — R519 Headache, unspecified: Secondary | ICD-10-CM

## 2022-01-17 DIAGNOSIS — G4484 Primary exertional headache: Secondary | ICD-10-CM

## 2022-01-17 DIAGNOSIS — R51 Headache with orthostatic component, not elsewhere classified: Secondary | ICD-10-CM

## 2022-01-17 DIAGNOSIS — G500 Trigeminal neuralgia: Secondary | ICD-10-CM

## 2022-01-17 MED ORDER — GADOBENATE DIMEGLUMINE 529 MG/ML IV SOLN
15.0000 mL | Freq: Once | INTRAVENOUS | Status: AC | PRN
  Administered 2022-01-17: 15 mL via INTRAVENOUS

## 2022-01-22 ENCOUNTER — Encounter: Payer: Self-pay | Admitting: *Deleted

## 2022-02-10 ENCOUNTER — Ambulatory Visit (INDEPENDENT_AMBULATORY_CARE_PROVIDER_SITE_OTHER): Payer: BC Managed Care – PPO | Admitting: Neurology

## 2022-02-10 ENCOUNTER — Encounter: Payer: Self-pay | Admitting: Neurology

## 2022-02-10 VITALS — BP 109/61 | HR 76 | Ht 72.0 in | Wt 168.8 lb

## 2022-02-10 DIAGNOSIS — G4484 Primary exertional headache: Secondary | ICD-10-CM | POA: Diagnosis not present

## 2022-02-10 DIAGNOSIS — R519 Headache, unspecified: Secondary | ICD-10-CM | POA: Diagnosis not present

## 2022-02-10 NOTE — Progress Notes (Signed)
[id number]    Provider:  Dr Jaynee Eagles Requesting Provider: Ginger Organ., MD Primary Care Provider:  Ginger Organ., MD  CC:  headaches, right temporal   02/10/2022: Headaches improved.   Personally reviewed images and MRA Head and MRI brain were normal. ESr/CRP were normal. Only time he has a headache is when he bends over, rare not all the time, and with cough, improved with exercise. He has been hydrating more maybe that helped. No other focal neurologic deficits, associated symptoms, inciting events or modifiable factors. Patient complains of symptoms per HPI as well as the following symptoms: slight/reare cough headache . Pertinent negatives and positives per HPI. All others negative   HPI:  Collin Ford is a 66 y.o. male here as requested by Ginger Organ., MD for peripheral headache.  I reviewed Dr. Raul Del notes: Patient had right temporal headaches for 3 to 4 months, aching, stable pattern, no sinus congestion or drainage, headaches are postural, no pain when laying down at night, occurs most of the time when he leans his head over, present when bending over, better when he sits up, hurts when he coughs, resolves quickly, no other neurologic symptoms, specifically denies vision changes, weakness, numbness, tingling, balance issues, no jaw claudication symptoms or bruxism, no allergy symptoms or drainage.  Dr. Raul Del examination was normal including general, head, eyes, ears, oral cavity, throat, neck, lungs, heart, musculoskeletal, extremities and neurologic.  Dr. Brigitte Pulse did check a sed rate but I don't see that result.   Rarely ever had headaches, started in December 2022, he was bending over and he noticed it, he was coughing a lot when he had a cough and it hurt a lot and was worse, sed rate was normal, no inciting events, he took propecia for hair loss for years and stopped it in the fall but tha is the only change he made, happens daily, anytime he  bears down, coughs, bends over, exerts himself, valsalva he can feel a problem in the right temple, when he bends over, brief, if he bears down, exertional and positional, first noticed it bending over, now he is really careful when bending over, better laying down, more pressure and just pain can't describe it, no FHx of headaches, his oldest daughter has migraines, he has not been to an eye doctor yet, no vision changes, no neck pain, no fatigue. No injury that he remembers. Not pulsating, pounding, no vision changes, no jaw pain or neck pain, no light/sound sensitivity or nausea. No hx of rash. Can happen multiple times a day. He is learning to not bend over during the day, affecting him daily even when he bears down he can feel it. No other focal neurologic deficits, associated symptoms, inciting events or modifiable factors.  Reviewed notes, labs and imaging from outside physicians, which showed:  CT head 10/25/2021:  showed No acute intracranial abnormalities including mass lesion or mass effect, hydrocephalus, extra-axial fluid collection, midline shift, hemorrhage, or acute infarction, large ischemic events (personally reviewed images)  Review of Systems: Patient complains of symptoms per HPI as well as the following symptoms headache. Pertinent negatives and positives per HPI. All others negative.   Social History   Socioeconomic History   Marital status: Married    Spouse name: Not on file   Number of children: Not on file   Years of education: Not on file   Highest education level: Not on file  Occupational History   Not on file  Tobacco Use   Smoking status: Never   Smokeless tobacco: Not on file  Vaping Use   Vaping Use: Never used  Substance and Sexual Activity   Alcohol use: Yes    Alcohol/week: 5.0 standard drinks of alcohol    Types: 5 Glasses of wine per week   Drug use: No   Sexual activity: Not on file  Other Topics Concern   Not on file  Social History Narrative    Not on file   Social Determinants of Health   Financial Resource Strain: Not on file  Food Insecurity: Not on file  Transportation Needs: Not on file  Physical Activity: Not on file  Stress: Not on file  Social Connections: Not on file  Intimate Partner Violence: Not on file    Family History  Problem Relation Age of Onset   Diabetes Father    Parkinsonism Father    Diabetes Other    Migraines Neg Hx    Headache Neg Hx     Past Medical History:  Diagnosis Date   Bursitis of left shoulder    HLD (hyperlipidemia)     Patient Active Problem List   Diagnosis Date Noted   Right temporal headache 12/23/2021   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     No current facility-administered medications for this visit.    Allergies as of 02/10/2022 - Review Complete 02/10/2022  Allergen Reaction Noted   Sulfamethoxazole      Vitals: BP 109/61   Pulse 76   Ht 6' (1.829 m)   Wt 168 lb 12.8 oz (76.6 kg)   BMI 22.89 kg/m  Last Weight:  Wt Readings from Last 1 Encounters:  02/10/22 168 lb 12.8 oz (76.6 kg)   Last Height:   Ht Readings from Last 1 Encounters:  02/10/22 6' (1.829 m)     Exam: NAD, pleasant                  Speech:    Speech is normal; fluent and spontaneous with normal comprehension.  Cognition:    The patient is oriented to person, place, and time;     recent and remote memory intact;     language fluent;    Cranial Nerves:    The pupils are equal, round, and reactive to light.Trigeminal sensation is intact and the muscles of mastication are normal. The face is symmetric. The palate elevates in the midline. Hearing intact. Voice is normal. Shoulder shrug is normal. The tongue has normal motion without  fasciculations.   Coordination:  No dysmetria  Motor Observation:    No asymmetry, no atrophy, and no involuntary movements noted. Tone:    Normal muscle tone.     Strength:    Strength is V/V in the upper and lower limbs.      Sensation: intact to LT    Assessment/Plan:  66 year old male with new onset positional and exertional right-sided temporal headaches that do not fit a primary headache disorder and atypical in quality. Unclear diagnosis with a wide differential as below:   Update 02/10/2022: Much improved, likely benign exertional headache which are self limited, MRI head and MRA brain normal. He is seeing Dr. Katy Fitch today, return as needed.    -  3T MRI/MRA brain/head and also add trigeminal protocol to see if there is vascular loop compressing the trigeminal nerve or any other trigeminal nerve etiology; will give Xanax: MRI brain due to concerning symptoms of morning headaches, positional and exertional headaches, worsening headaches, daily headaches  to look for space occupying mass, chiari or intracranial hypertension (pseudotumor), strokes, malignancies, vasculidities, demyelination(multiple sclerosis), aneurysm or other. Will also order MRI with trigeminal protocol to see if there is a vascular loop on the trigeminal nerve causing v1 pain with valsalva/bending over and coughing.   -Temporal arteritis less likely but will order an Esr/crp. Could consider a temporal artery biopsy or even treat with a course of steroids -Positional quality could indicate low pressure headache or Spontaneous csf leak, Could try blood patch.  -Benign exertional/cough headache is a diagnosis of exclusion -Eye exam, I will contact Dr. Katy Fitch today, will make sure no right eye pathology causing this unusual headache  3T MRI due to positional and   No orders of the defined types were placed in this encounter.  No orders of the defined types were placed in this encounter.   Cc: Ginger Organ., MD,  Ginger Organ., MD  Sarina Ill, MD  [facility] [address] [place], [place] [address]  Phone [phone] Fax [phone]  I spent 10 minutes of face-to-face and non-face-to-face time with patient on the  1. Benign exertional headache    diagnosis.  This included previsit chart review, lab review, study review, order entry, electronic health record documentation, patient education on the different diagnostic and therapeutic options, counseling and coordination of care, risks and benefits of management, compliance, or risk factor reduction

## 2022-04-09 DIAGNOSIS — E785 Hyperlipidemia, unspecified: Secondary | ICD-10-CM | POA: Diagnosis not present

## 2022-04-09 DIAGNOSIS — Z1331 Encounter for screening for depression: Secondary | ICD-10-CM | POA: Diagnosis not present

## 2022-04-09 DIAGNOSIS — Z1339 Encounter for screening examination for other mental health and behavioral disorders: Secondary | ICD-10-CM | POA: Diagnosis not present

## 2022-04-09 DIAGNOSIS — Z0000 Encounter for general adult medical examination without abnormal findings: Secondary | ICD-10-CM | POA: Diagnosis not present

## 2022-04-09 DIAGNOSIS — R7301 Impaired fasting glucose: Secondary | ICD-10-CM | POA: Diagnosis not present

## 2022-04-10 DIAGNOSIS — K090 Developmental odontogenic cysts: Secondary | ICD-10-CM | POA: Diagnosis not present

## 2022-04-28 ENCOUNTER — Other Ambulatory Visit (HOSPITAL_BASED_OUTPATIENT_CLINIC_OR_DEPARTMENT_OTHER): Payer: Self-pay

## 2022-04-28 MED ORDER — INFLUENZA VAC SPLIT QUAD 0.5 ML IM SUSY
PREFILLED_SYRINGE | INTRAMUSCULAR | 0 refills | Status: AC
  Filled 2022-04-28: qty 0.5, 1d supply, fill #0

## 2022-04-28 MED ORDER — TETANUS-DIPHTH-ACELL PERTUSSIS 5-2.5-18.5 LF-MCG/0.5 IM SUSY
PREFILLED_SYRINGE | INTRAMUSCULAR | 0 refills | Status: AC
  Filled 2022-04-28: qty 0.5, 1d supply, fill #0

## 2023-02-04 DIAGNOSIS — Z86010 Personal history of colonic polyps: Secondary | ICD-10-CM | POA: Diagnosis not present

## 2023-02-04 DIAGNOSIS — Z09 Encounter for follow-up examination after completed treatment for conditions other than malignant neoplasm: Secondary | ICD-10-CM | POA: Diagnosis not present
